# Patient Record
Sex: Male | Born: 2000 | Race: White | Hispanic: No | Marital: Single | Smoking: Never smoker
Health system: Southern US, Community
[De-identification: ages and names within clinical notes are randomized; demographics above are authoritative.]

---
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
## 2019-01-06 ENCOUNTER — Ambulatory Visit
Admission: RE | Admit: 2019-01-06 | Discharge: 2019-01-06 | Disposition: A | Discharge status: Home / Self Care | Payer: BLUE CROSS/BLUE SHIELD | Source: Ambulatory Visit | Attending: Family Medicine | Admitting: Family Medicine

## 2019-01-06 ENCOUNTER — Other Ambulatory Visit: Payer: Self-pay

## 2019-01-06 DIAGNOSIS — N50819 Testicular pain, unspecified: Secondary | ICD-10-CM | POA: Insufficient documentation

## 2019-10-05 ENCOUNTER — Ambulatory Visit: Payer: 59

## 2019-10-07 ENCOUNTER — Ambulatory Visit: Payer: Self-pay

## 2020-03-07 IMAGING — US US SCROTUM W/ DOPPLER COMPLETE
1 series · 14 of 25 positions shown · non-contrast
Comparison: None.

CLINICAL DATA: Left testicle pain for 3 months

EXAM:
SCROTAL ULTRASOUND
DOPPLER ULTRASOUND OF THE TESTICLES
TECHNIQUE: Complete ultrasound examination of the testicles, epididymis, and
other scrotal structures was performed. Color and spectral Doppler
ultrasound were also utilized to evaluate blood flow to the
testicles.

[Series 1: us scrotum w/ doppler complete · 0.06mm/px · 14 of 72 slices shown]
[im 1/72]
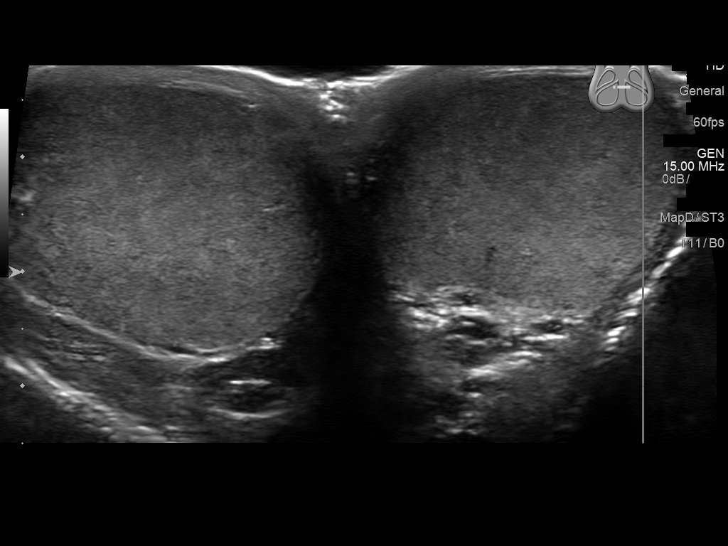
[im 6/72]
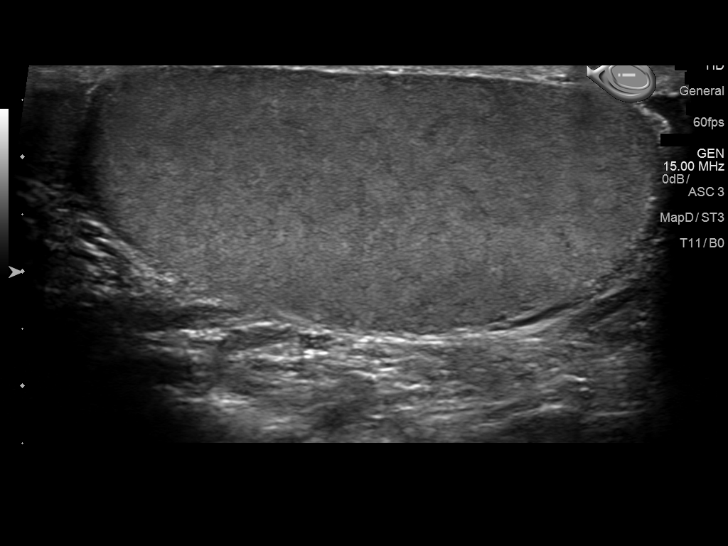
[im 12/72]
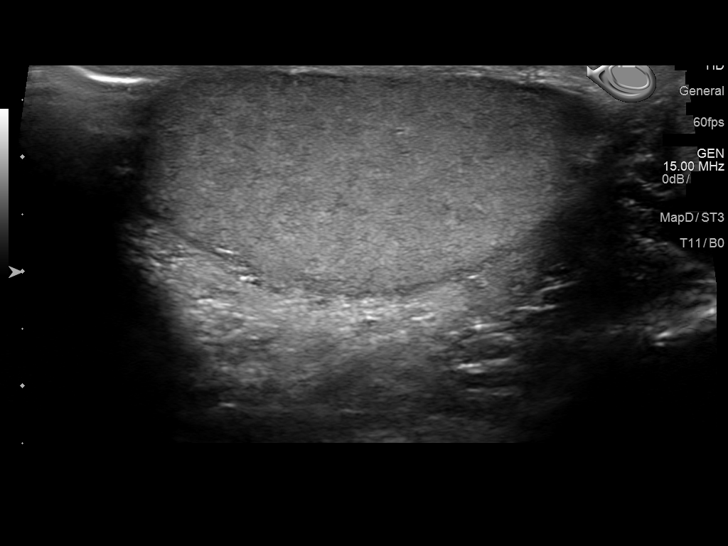
[im 18/72]
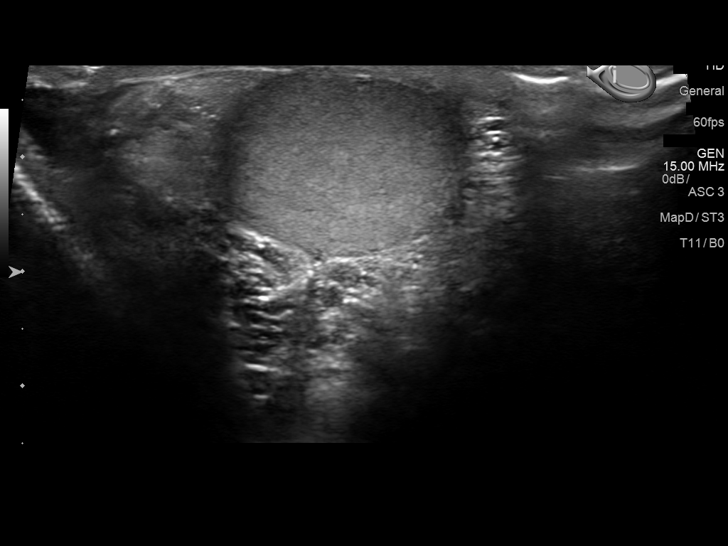
[im 24/72]
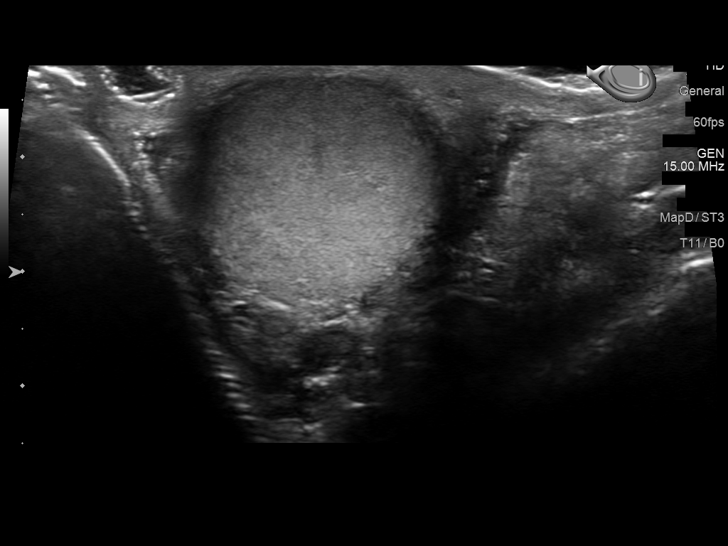
[im 27/72]
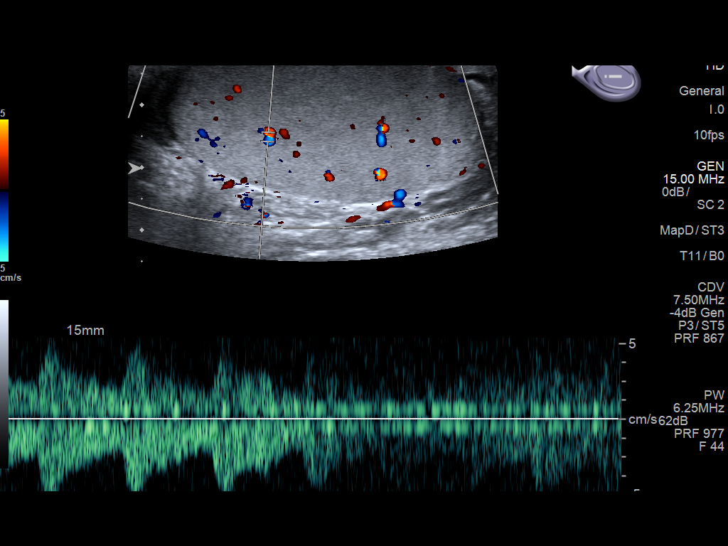
[im 33/72]
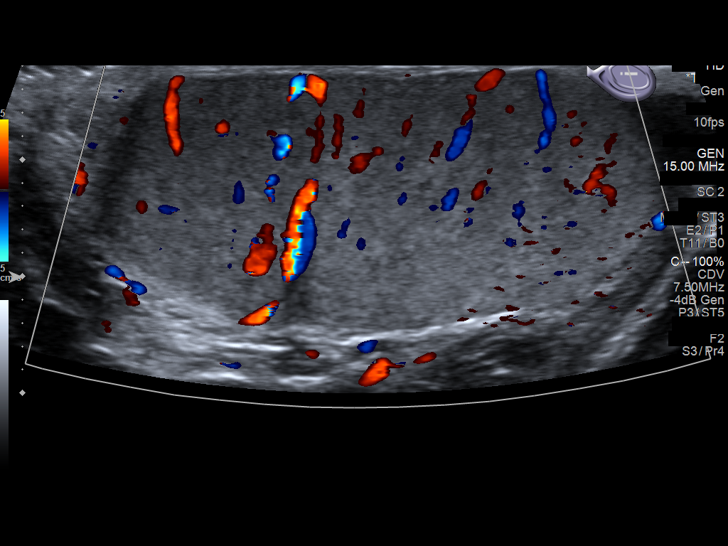
[im 39/72]
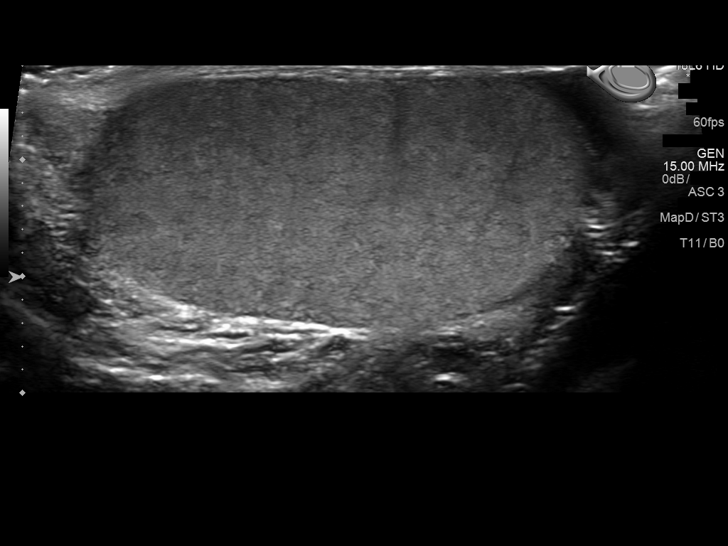
[im 45/72]
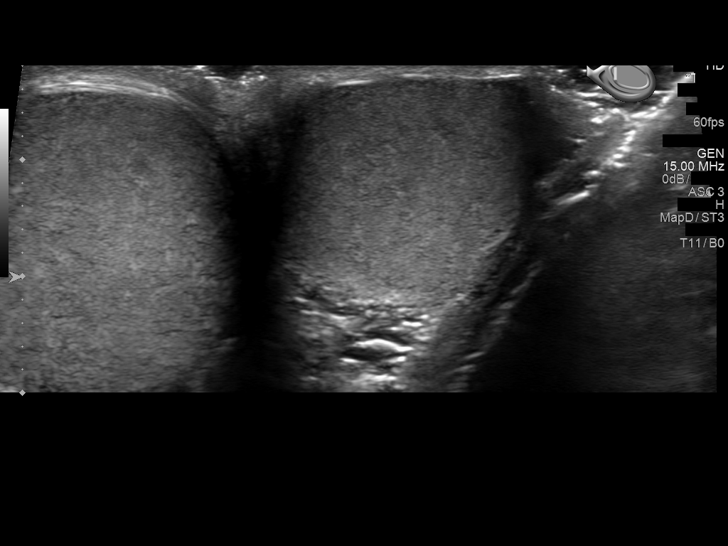
[im 48/72]
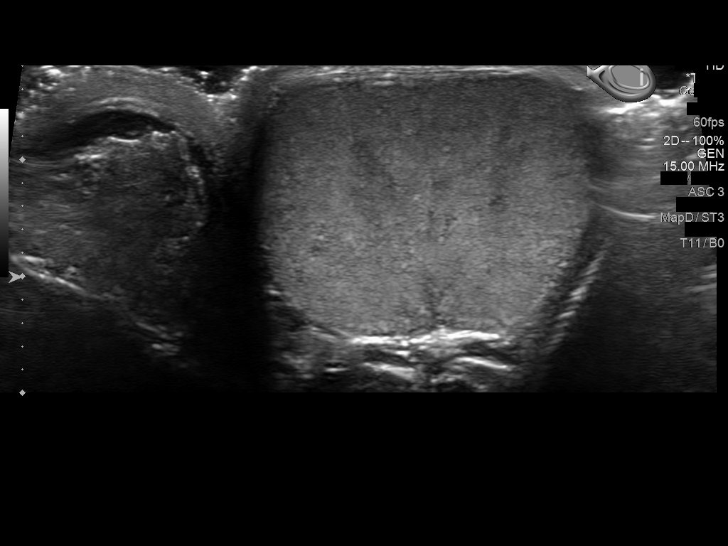
[im 54/72]
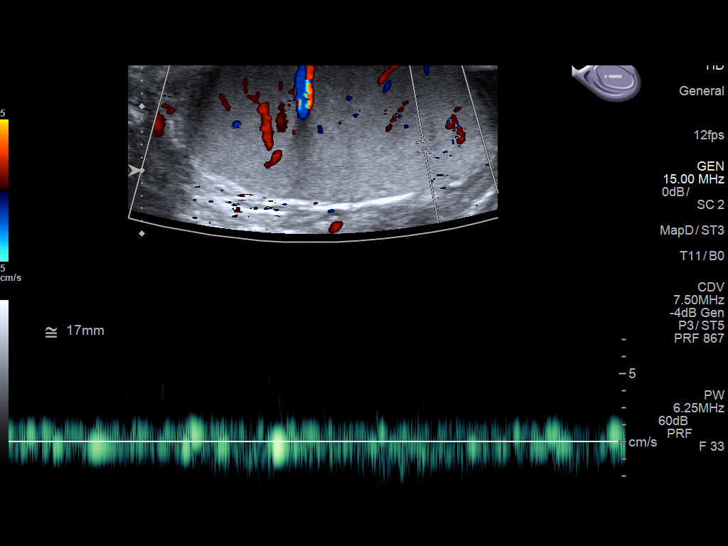
[im 60/72]
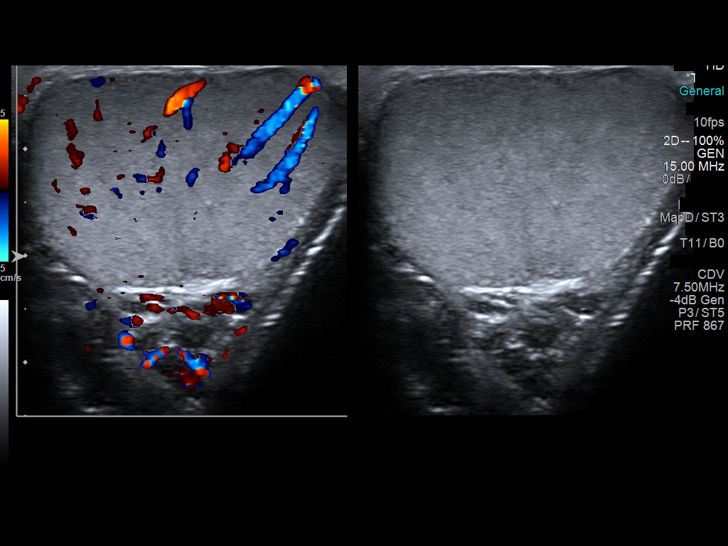
[im 66/72]
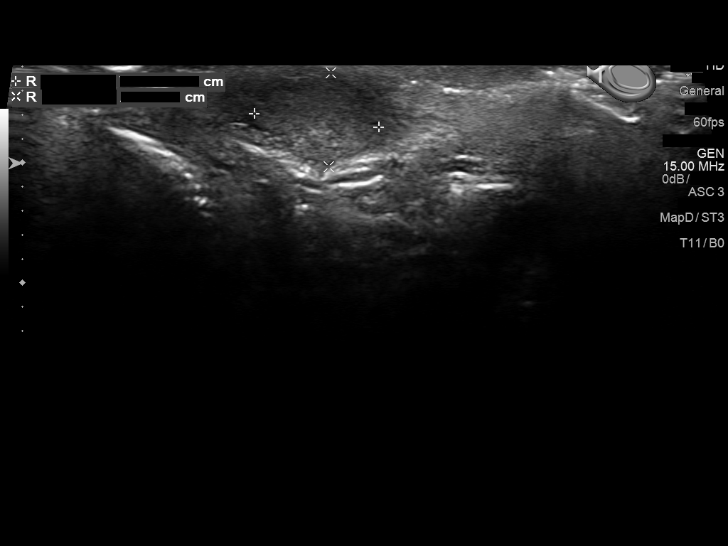
[im 72/72]
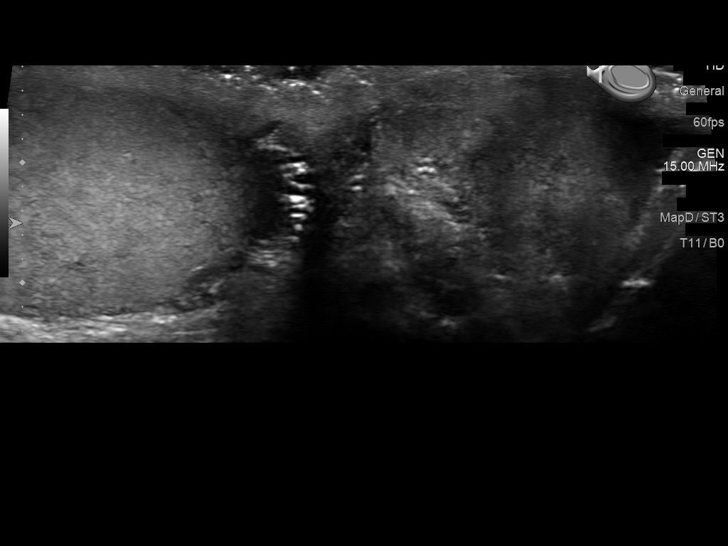

[14 of 25 positions shown; findings below may reference images not displayed]

FINDINGS: Right testicle

Measurements: 5.1 x 2.2 x 2.9 cm. No mass or microlithiasis
visualized.

Left testicle

Measurements: 4.8 x 1.9 x 3 cm. No mass or microlithiasis
visualized.

Right epididymis:  Normal in size and appearance.

Left epididymis:  Normal in size and appearance.

Hydrocele:  None visualized.

Varicocele:  None visualized.

Pulsed Doppler interrogation of both testes demonstrates normal low
resistance arterial and venous waveforms bilaterally.
IMPRESSION: Negative scrotal ultrasound

## 2021-02-05 ENCOUNTER — Ambulatory Visit: Payer: Self-pay | Admitting: Urology

## 2021-02-11 ENCOUNTER — Other Ambulatory Visit: Payer: Self-pay

## 2021-02-11 DIAGNOSIS — N50819 Testicular pain, unspecified: Secondary | ICD-10-CM

## 2021-02-12 ENCOUNTER — Other Ambulatory Visit
Admission: RE | Admit: 2021-02-12 | Discharge: 2021-02-12 | Disposition: A | Discharge status: Home / Self Care | Payer: 59 | Attending: Urology | Admitting: Urology

## 2021-02-12 ENCOUNTER — Encounter: Payer: Self-pay | Admitting: Urology

## 2021-02-12 ENCOUNTER — Ambulatory Visit (INDEPENDENT_AMBULATORY_CARE_PROVIDER_SITE_OTHER): Payer: 59 | Admitting: Urology

## 2021-02-12 ENCOUNTER — Other Ambulatory Visit: Payer: Self-pay

## 2021-02-12 VITALS — BP 118/69 | HR 85 | Ht 68.0 in | Wt 137.0 lb

## 2021-02-12 DIAGNOSIS — N50819 Testicular pain, unspecified: Secondary | ICD-10-CM | POA: Diagnosis not present

## 2021-02-12 DIAGNOSIS — I861 Scrotal varices: Secondary | ICD-10-CM | POA: Diagnosis not present

## 2021-02-12 DIAGNOSIS — A64 Unspecified sexually transmitted disease: Secondary | ICD-10-CM | POA: Diagnosis not present

## 2021-02-12 DIAGNOSIS — N5082 Scrotal pain: Secondary | ICD-10-CM | POA: Diagnosis not present

## 2021-02-12 LAB — URINALYSIS, COMPLETE (UACMP) WITH MICROSCOPIC
Bacteria, UA: NONE SEEN
Bilirubin Urine: NEGATIVE
Glucose, UA: NEGATIVE mg/dL
Hgb urine dipstick: NEGATIVE
Ketones, ur: NEGATIVE mg/dL
Leukocytes,Ua: NEGATIVE
Nitrite: NEGATIVE
Protein, ur: NEGATIVE mg/dL
Specific Gravity, Urine: <1.005 — ABNORMAL LOW (ref 1.005–1.030)
Squamous Epithelial / HPF: NONE SEEN (ref 0–5)
WBC, UA: NONE SEEN WBC/hpf (ref 0–5)
pH: 6 (ref 5.0–8.0)

## 2021-02-12 MED ORDER — CELECOXIB 200 MG PO CAPS: 200.0000 mg | ORAL_CAPSULE | Freq: Every day | ORAL | 0 refills | Status: AC

## 2021-02-12 NOTE — Patient Instructions (Signed)
Varicocele  A varicocele is a swelling of veins in the scrotum. The scrotum is the sac that contains the testicles. Varicoceles can occur on either side of the scrotum, but they are more common on the left side. They occur most often in teenage boys and young men. In most cases, varicoceles are not a serious problem. They are usually small and painless and do not require treatment. Tests may be done to confirm the diagnosis. Treatment may be needed if:  A varicocele is large, causes a lot of pain, or causes pain when exercising.  Varicoceles are found on both sides of the scrotum.  A varicocele causes a decrease in the size of the testicle in a growing adolescent.  The person has fertility problems. What are the causes? This condition is the result of valves in the veins not working properly. Valves in the veins help to return blood from the scrotum and testicles to the heart. If these valves do not work well, blood flows backward and backs up into the veins, which causes the veins to swell. This is similar to what happens when varicose veins form in the leg. What are the signs or symptoms? Most varicoceles do not cause any symptoms. If symptoms do occur, they may include:  Swelling on one side of the scrotum. The swelling may be more obvious when you are standing up.  A lumpy feeling in the scrotum.  A heavy feeling on one side of the scrotum.  A dull ache in the scrotum, especially after exercise or prolonged standing or sitting.  Slower growth or reduced size of the testicle on the side of the varicocele (in young males).  Problems with fathering a child (fertility). This can occur if the testicle does not grow normally or if the condition causes problems with the sperm, such as a low sperm count or sperm that are not able to reach the egg (poor motility). How is this diagnosed? This condition is diagnosed based on:  Your medical history.  A physical exam. Your health care  provider may inspect and feel (palpate) the scrotal area to check for swollen or enlarged veins.  An ultrasound. This may be done to confirm the diagnosis and to help rule out other causes of the swelling. How is this treated? Treatment is usually not needed for this condition. If you have any pain, your health care provider may prescribe or recommend medicine to help relieve it. You may need regular exams so your health care provider can monitor the varicocele to ensure that it does not cause problems. When further treatment is needed, it may involve one of these options:  Varicocelectomy. This is a surgery in which the swollen veins are tied off so that the flow of blood goes to other veins instead.  Embolization. In this procedure, a small, thin tube (catheter) is used to place metal coils or other blocking items in the veins. This cuts off the blood flow to the swollen veins. Follow these instructions at home:  Take over-the-counter and prescription medicines only as told by your health care provider.  Wear supportive underwear.  Use an athletic supporter when participating in sports activities.  Keep all follow-up visits as told by your health care provider. This is important. Contact a health care provider if:  Your pain is increasing.  You have redness in the affected area.  Your testicle becomes enlarged, swollen, or painful.  You have swelling that does not decrease when you are lying down.    One of your testicles is smaller than the other. Get help right away if:  You develop swelling in your legs.  You have difficulty breathing. Summary  Varicocele is a condition in which the veins in the scrotum are swollen or enlarged.  In most cases, varicoceles do not require treatment.  Treatment may be needed if you have pain, have problems with infertility, or have a smaller testicle associated with the varicocele.  In some cases, the condition may be treated with a  procedure to cut off the flow of blood to the swollen veins. This information is not intended to replace advice given to you by your health care provider. Make sure you discuss any questions you have with your health care provider. Document Revised: 01/07/2019 Document Reviewed: 01/07/2019 Elsevier Patient Education  2021 [employer]   Varicocelectomy  Varicocelectomy is a procedure to treat a varicocele. A varicocele is a swelling of veins inside the pouch that holds the testicles (scrotum). You may need this surgery if a varicocele is causing pain, shrinking your testicle, or making it hard for you to father a child (infertility). In this surgery, the swollen veins are tied off so that the flow of blood goes to other veins instead. The surgery may be done in different ways, including:  Open traditional. The repair is done through one large incision.  Laparoscopic. The repair is done with the help of a thin, lighted tube that has a camera (laparoscope). The scope and other tools are inserted through a few small incisions.  Microscopic. A microscope is used to find all of the small veins that need repair. Your surgeon will help you decide which approach is best for your condition. Tell a health care provider about:  Any allergies you have.  All medicines you are taking, including vitamins, herbs, eye drops, creams, and over-the-counter medicines.  Any problems you or family members have had with anesthetic medicines.  Any blood disorders you have.  Any surgeries you have had.  Any medical conditions you have. What are the risks? Generally, this is a safe procedure. However, problems may occur, including:  Bleeding.  Infection.  Allergic reactions to medicines.  Damage to the testicle.  Damage to the blood vessels that supply the testicle.  Fluid buildup in the scrotum (hydrocele). What happens before the procedure? Staying hydrated Follow instructions from your health  care provider about hydration, which may include:  Up to 2 hours before the procedure - you may continue to drink clear liquids, such as water, clear fruit juice, black coffee, and plain tea. Eating and drinking restrictions Follow instructions from your health care provider about eating and drinking, which may include:  8 hours before the procedure - stop eating heavy meals or foods, such as meat, fried foods, or fatty foods.  6 hours before the procedure - stop eating light meals or foods, such as toast or cereal.  6 hours before the procedure - stop drinking milk or drinks that contain milk.  2 hours before the procedure - stop drinking clear liquids. Medicines Ask your health care provider about:  Changing or stopping your regular medicines. This is especially important if you are taking diabetes medicines or blood thinners.  Taking medicines such as aspirin and ibuprofen. These medicines can thin your blood. Do not take these medicines unless your health care provider tells you to take them.  Taking over-the-counter medicines, vitamins, herbs, and supplements. General instructions  Ask your health care provider: ? How your surgery  site will be marked. ? What steps will be taken to help prevent infection. These may include:  Removing hair at the surgery site.  Washing skin with a germ-killing soap.  Taking antibiotic medicine.  Do not use any products that contain nicotine or tobacco for at least 4 weeks before the procedure. These products include cigarettes, e-cigarettes, and chewing tobacco. If you need help quitting, ask your health care provider.  Plan to have someone take you home from the hospital or clinic. What happens during the procedure?  An IV will be inserted into one of your veins.  You will be given one or more of the following: ? A medicine to help you relax (sedative). ? A medicine to numb the area (local anesthetic). ? A medicine to make you fall  asleep (general anesthetic).  One to three incisions will be made. The size, location, and number of incisions will depend on the technique and approach used by the surgeon. Incisions may be made in any of these areas: ? Abdomen (retroperitoneal approach). ? Groin (inguinal approach). ? Below the groin (subinguinal approach).  The veins will be clipped or cut and tied off.  The incisions will be closed with stitches (sutures). Small adhesive strips may also be placed over the incisions.  The incision area will be covered with a light bandage (dressing). The procedure may vary among health care providers and hospitals. What happens after the procedure?  Your blood pressure, heart rate, breathing rate, and blood oxygen level will be monitored until you leave the hospital or clinic.  You will be given pain medicine as needed.  You may have to wear an athletic support strap to hold the dressing in place and to support your scrotum.  Do not drive for 24 hours if you were given a sedative during your procedure. Summary  Varicocelectomy is a procedure to treat a varicocele, which is a swelling of veins inside the pouch that holds your testicles (scrotum).  In this surgery, the swollen veins are tied off so that the flow of blood goes to other veins instead.  Follow instructions from your health care provider about taking medicines and about eating and drinking before the procedure. This information is not intended to replace advice given to you by your health care provider. Make sure you discuss any questions you have with your health care provider. Document Revised: 01/07/2019 Document Reviewed: 01/07/2019 Elsevier Patient Education  2021 [employer].

## 2021-02-12 NOTE — Progress Notes (Signed)
   02/12/21 9:32 AM   Darden Palmer 2001-01-22 [id number]  CC: Left scrotal pain, recent STD  HPI: I saw Mr. Rotan for the above issues.  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  He has noticed some left testicular achiness since that time.  He denies any severe pain or shooting pain.  He has a history of a scrotal ultrasound in 2020 that was benign.  There are no aggravating or alleviating factors.  He denies any urinary symptoms or gross hematuria.  Social History:  reports that he has never smoked. He has never used smokeless tobacco. He reports that he does not drink alcohol and does not use drugs.  Physical Exam: BP 118/69   Pulse 85   Ht 5\' 8"  (1.727 m)   Wt 137 lb (62.1 kg)   BMI 20.83 kg/m    Constitutional:  Alert and oriented, No acute distress. Cardiovascular: No clubbing, cyanosis, or edema. Respiratory: Normal respiratory effort, no increased work of breathing. GI: Abdomen is soft, nontender, nondistended, no abdominal masses GU: Circumcised phallus with patent meatus, testicles 21 cc and descended bilaterally without masses, large left varicocele  Laboratory Data: Reviewed  Pertinent Imaging: I have personally viewed and interpreted the scrotal ultrasound from 2020 showing no testicular masses or other abnormalities  Assessment & Plan:   20 year old male with 2 months of achy left scrotal pain since being diagnosed with a Chlamydia STD and treated with antibiotics.  On exam he has a large left varicocele.  We discussed treatment options extensively starting with conservative management with snug fitting underwear, icing as needed, 2-week course of NSAIDs.  Return precautions discussed extensively including new testicular masses, worsening pain, or enlarging varicocele.  We discussed varicocele treatment options briefly including embolization or microscopic  varicocelectomy.  He is moving back to [place] for school, and will call if he desires follow-up here in [place].  Trial of Celebrex x2 weeks for left scrotal pain Return precautions discussed extensively, he prefers as needed follow-up  [place], MD 02/12/2021  [facility] [address] [place], [place] [phone]
